# Patient Record
Sex: Female | Born: 1940 | Race: Black or African American | Hispanic: No | Marital: Single
Health system: Southern US, Community
[De-identification: ages and names within clinical notes are randomized; demographics above are authoritative.]

---

## 2011-05-22 ENCOUNTER — Ambulatory Visit: Payer: Self-pay | Admitting: Internal Medicine

## 2011-05-26 ENCOUNTER — Inpatient Hospital Stay: Payer: Self-pay | Admitting: Surgery

## 2011-05-26 LAB — URINALYSIS, COMPLETE
Bilirubin,UR: NEGATIVE
Blood: NEGATIVE
Glucose,UR: NEGATIVE mg/dL (ref 0–75)
Hyaline Cast: 3
Leukocyte Esterase: NEGATIVE
Protein: 100
RBC,UR: 7 /HPF (ref 0–5)
Specific Gravity: 1.018 (ref 1.003–1.030)
Squamous Epithelial: NONE SEEN
WBC UR: 2 /HPF (ref 0–5)

## 2011-05-26 LAB — CBC
HCT: 36.6 % (ref 35.0–47.0)
MCHC: 31.2 g/dL — ABNORMAL LOW (ref 32.0–36.0)
Platelet: 202 10*3/uL (ref 150–440)
RBC: 4.38 10*6/uL (ref 3.80–5.20)
RDW: 17.9 % — ABNORMAL HIGH (ref 11.5–14.5)
WBC: 10.5 10*3/uL (ref 3.6–11.0)

## 2011-05-26 LAB — COMPREHENSIVE METABOLIC PANEL
Albumin: 2.2 g/dL — ABNORMAL LOW (ref 3.4–5.0)
BUN: 51 mg/dL — ABNORMAL HIGH (ref 7–18)
Chloride: 104 mmol/L (ref 98–107)
Co2: 25 mmol/L (ref 21–32)
Creatinine: 2.98 mg/dL — ABNORMAL HIGH (ref 0.60–1.30)
Osmolality: 298 (ref 275–301)
Potassium: 2.9 mmol/L — ABNORMAL LOW (ref 3.5–5.1)
SGOT(AST): 18 U/L (ref 15–37)
Sodium: 143 mmol/L (ref 136–145)

## 2011-05-26 LAB — TROPONIN I: Troponin-I: 0.07 ng/mL — ABNORMAL HIGH

## 2011-05-26 LAB — LIPASE, BLOOD: Lipase: 51 U/L — ABNORMAL LOW (ref 73–393)

## 2011-05-27 LAB — CBC WITH DIFFERENTIAL/PLATELET
Bands: 29 %
Basophil: 1 %
HCT: 32.8 % — ABNORMAL LOW (ref 35.0–47.0)
HGB: 10.2 g/dL — ABNORMAL LOW (ref 12.0–16.0)
MCH: 26 pg (ref 26.0–34.0)
MCHC: 31 g/dL — ABNORMAL LOW (ref 32.0–36.0)
MCV: 84 fL (ref 80–100)
Platelet: 182 10*3/uL (ref 150–440)
RBC: 3.91 10*6/uL (ref 3.80–5.20)
Segmented Neutrophils: 60 %

## 2011-05-27 LAB — COMPREHENSIVE METABOLIC PANEL
Alkaline Phosphatase: 71 U/L (ref 50–136)
Anion Gap: 17 — ABNORMAL HIGH (ref 7–16)
BUN: 56 mg/dL — ABNORMAL HIGH (ref 7–18)
Bilirubin,Total: 1.3 mg/dL — ABNORMAL HIGH (ref 0.2–1.0)
Chloride: 105 mmol/L (ref 98–107)
Creatinine: 3.1 mg/dL — ABNORMAL HIGH (ref 0.60–1.30)
EGFR (African American): 19 — ABNORMAL LOW
EGFR (Non-African Amer.): 16 — ABNORMAL LOW
SGOT(AST): 20 U/L (ref 15–37)
SGPT (ALT): 14 U/L
Sodium: 145 mmol/L (ref 136–145)
Total Protein: 7.2 g/dL (ref 6.4–8.2)

## 2011-05-28 LAB — CBC WITH DIFFERENTIAL/PLATELET
Bands: 16 %
HCT: 31.1 % — ABNORMAL LOW (ref 35.0–47.0)
HGB: 9.3 g/dL — ABNORMAL LOW (ref 12.0–16.0)
MCH: 24.6 pg — ABNORMAL LOW (ref 26.0–34.0)
Monocytes: 5 %
Platelet: 184 10*3/uL (ref 150–440)
RBC: 3.79 10*6/uL — ABNORMAL LOW (ref 3.80–5.20)
Segmented Neutrophils: 70 %

## 2011-05-28 LAB — BASIC METABOLIC PANEL
Calcium, Total: 8.4 mg/dL — ABNORMAL LOW (ref 8.5–10.1)
Creatinine: 3.11 mg/dL — ABNORMAL HIGH (ref 0.60–1.30)
EGFR (African American): 19 — ABNORMAL LOW
EGFR (Non-African Amer.): 16 — ABNORMAL LOW
Potassium: 3.2 mmol/L — ABNORMAL LOW (ref 3.5–5.1)
Sodium: 145 mmol/L (ref 136–145)

## 2011-05-29 LAB — CBC WITH DIFFERENTIAL/PLATELET
Basophil #: 0 10*3/uL (ref 0.0–0.1)
Basophil %: 0 %
Eosinophil #: 0 10*3/uL (ref 0.0–0.7)
Eosinophil %: 0.2 %
HCT: 28.7 % — ABNORMAL LOW (ref 35.0–47.0)
HGB: 9.1 g/dL — ABNORMAL LOW (ref 12.0–16.0)
Lymphocyte %: 5.2 %
MCHC: 31.7 g/dL — ABNORMAL LOW (ref 32.0–36.0)
Monocyte %: 5.2 %
Neutrophil #: 13.1 10*3/uL — ABNORMAL HIGH (ref 1.4–6.5)
Neutrophil %: 89.4 %
RBC: 3.48 10*6/uL — ABNORMAL LOW (ref 3.80–5.20)
RDW: 18 % — ABNORMAL HIGH (ref 11.5–14.5)
WBC: 14.7 10*3/uL — ABNORMAL HIGH (ref 3.6–11.0)

## 2011-05-29 LAB — BASIC METABOLIC PANEL
BUN: 64 mg/dL — ABNORMAL HIGH (ref 7–18)
Calcium, Total: 8.7 mg/dL (ref 8.5–10.1)
Creatinine: 3.05 mg/dL — ABNORMAL HIGH (ref 0.60–1.30)
Glucose: 159 mg/dL — ABNORMAL HIGH (ref 65–99)
Potassium: 3.5 mmol/L (ref 3.5–5.1)
Sodium: 143 mmol/L (ref 136–145)

## 2011-05-30 LAB — BASIC METABOLIC PANEL
Calcium, Total: 8.5 mg/dL (ref 8.5–10.1)
Chloride: 107 mmol/L (ref 98–107)
Co2: 20 mmol/L — ABNORMAL LOW (ref 21–32)
Creatinine: 3.01 mg/dL — ABNORMAL HIGH (ref 0.60–1.30)
EGFR (African American): 20 — ABNORMAL LOW
Sodium: 142 mmol/L (ref 136–145)

## 2011-05-31 LAB — BASIC METABOLIC PANEL
Anion Gap: 15 (ref 7–16)
Calcium, Total: 8.3 mg/dL — ABNORMAL LOW (ref 8.5–10.1)
Chloride: 106 mmol/L (ref 98–107)
Co2: 19 mmol/L — ABNORMAL LOW (ref 21–32)
Creatinine: 2.97 mg/dL — ABNORMAL HIGH (ref 0.60–1.30)
EGFR (African American): 20 — ABNORMAL LOW
Osmolality: 300 (ref 275–301)

## 2011-05-31 LAB — CBC WITH DIFFERENTIAL/PLATELET
Basophil #: 0 10*3/uL (ref 0.0–0.1)
Basophil %: 0.1 %
Eosinophil %: 1.1 %
HGB: 8.5 g/dL — ABNORMAL LOW (ref 12.0–16.0)
Lymphocyte #: 0.7 10*3/uL — ABNORMAL LOW (ref 1.0–3.6)
MCH: 26.3 pg (ref 26.0–34.0)
Monocyte #: 1.4 10*3/uL — ABNORMAL HIGH (ref 0.0–0.7)
Monocyte %: 10 %
Neutrophil %: 84 %
Platelet: 201 10*3/uL (ref 150–440)
RBC: 3.24 10*6/uL — ABNORMAL LOW (ref 3.80–5.20)

## 2011-06-01 LAB — CBC WITH DIFFERENTIAL/PLATELET
Basophil #: 0 10*3/uL (ref 0.0–0.1)
Basophil %: 0 %
Eosinophil #: 0.1 10*3/uL (ref 0.0–0.7)
Eosinophil %: 0.4 %
HCT: 27.2 % — ABNORMAL LOW (ref 35.0–47.0)
HGB: 8.5 g/dL — ABNORMAL LOW (ref 12.0–16.0)
Lymphocyte #: 0.5 10*3/uL — ABNORMAL LOW (ref 1.0–3.6)
MCH: 25.9 pg — ABNORMAL LOW (ref 26.0–34.0)
MCHC: 31.4 g/dL — ABNORMAL LOW (ref 32.0–36.0)
MCV: 82 fL (ref 80–100)
Monocyte %: 9.4 %
Platelet: 220 10*3/uL (ref 150–440)
RBC: 3.3 10*6/uL — ABNORMAL LOW (ref 3.80–5.20)
WBC: 15.7 10*3/uL — ABNORMAL HIGH (ref 3.6–11.0)

## 2011-06-01 LAB — BASIC METABOLIC PANEL
Calcium, Total: 8.2 mg/dL — ABNORMAL LOW (ref 8.5–10.1)
Chloride: 104 mmol/L (ref 98–107)
Co2: 17 mmol/L — ABNORMAL LOW (ref 21–32)
EGFR (African American): 20 — ABNORMAL LOW
Osmolality: 292 (ref 275–301)
Sodium: 136 mmol/L (ref 136–145)

## 2011-06-22 ENCOUNTER — Ambulatory Visit: Payer: Self-pay | Admitting: Internal Medicine

## 2011-09-21 ENCOUNTER — Ambulatory Visit: Payer: Self-pay | Admitting: Internal Medicine

## 2011-09-22 ENCOUNTER — Inpatient Hospital Stay: Payer: Self-pay | Admitting: Internal Medicine

## 2011-09-22 LAB — BASIC METABOLIC PANEL
Anion Gap: 8 (ref 7–16)
BUN: 33 mg/dL — ABNORMAL HIGH (ref 7–18)
Chloride: 103 mmol/L (ref 98–107)
Creatinine: 2.04 mg/dL — ABNORMAL HIGH (ref 0.60–1.30)
EGFR (African American): 28 — ABNORMAL LOW
EGFR (Non-African Amer.): 24 — ABNORMAL LOW
Glucose: 126 mg/dL — ABNORMAL HIGH (ref 65–99)

## 2011-09-22 LAB — PROTIME-INR
INR: 1.1
Prothrombin Time: 14.4 secs (ref 11.5–14.7)

## 2011-09-22 LAB — CBC
HCT: 22.5 % — ABNORMAL LOW (ref 35.0–47.0)
MCHC: 31.4 g/dL — ABNORMAL LOW (ref 32.0–36.0)
MCV: 82 fL (ref 80–100)
Platelet: 246 10*3/uL (ref 150–440)
RBC: 2.74 10*6/uL — ABNORMAL LOW (ref 3.80–5.20)
RDW: 17.8 % — ABNORMAL HIGH (ref 11.5–14.5)
WBC: 9.2 10*3/uL (ref 3.6–11.0)

## 2011-09-22 LAB — URINALYSIS, COMPLETE
Bilirubin,UR: NEGATIVE
Ketone: NEGATIVE
Ph: 6 (ref 4.5–8.0)
Protein: 100
RBC,UR: 91 /HPF (ref 0–5)
Squamous Epithelial: 4

## 2011-09-22 LAB — DIGOXIN LEVEL: Digoxin: 0.31 ng/mL

## 2011-09-23 LAB — IRON AND TIBC
Iron Bind.Cap.(Total): 119 ug/dL — ABNORMAL LOW (ref 250–450)
Unbound Iron-Bind.Cap.: 9 ug/dL

## 2011-09-23 LAB — BASIC METABOLIC PANEL
Anion Gap: 8 (ref 7–16)
BUN: 33 mg/dL — ABNORMAL HIGH (ref 7–18)
Calcium, Total: 7.9 mg/dL — ABNORMAL LOW (ref 8.5–10.1)
Chloride: 106 mmol/L (ref 98–107)
Co2: 24 mmol/L (ref 21–32)
Creatinine: 1.92 mg/dL — ABNORMAL HIGH (ref 0.60–1.30)
EGFR (Non-African Amer.): 26 — ABNORMAL LOW
Sodium: 138 mmol/L (ref 136–145)

## 2011-09-23 LAB — CBC WITH DIFFERENTIAL/PLATELET
Basophil #: 0.1 10*3/uL (ref 0.0–0.1)
Basophil %: 0.9 %
Eosinophil %: 1.4 %
HGB: 6.5 g/dL — ABNORMAL LOW (ref 12.0–16.0)
Lymphocyte #: 2.4 10*3/uL (ref 1.0–3.6)
Lymphocyte %: 27.8 %
Monocyte #: 0.9 x10 3/mm (ref 0.2–0.9)
Monocyte %: 10.1 %
Neutrophil %: 59.8 %
RBC: 2.54 10*6/uL — ABNORMAL LOW (ref 3.80–5.20)
WBC: 8.8 10*3/uL (ref 3.6–11.0)

## 2011-09-23 LAB — HEMATOCRIT: HCT: 24.8 % — ABNORMAL LOW (ref 35.0–47.0)

## 2011-09-23 LAB — MAGNESIUM: Magnesium: 1.7 mg/dL — ABNORMAL LOW

## 2011-09-23 LAB — OCCULT BLOOD X 1 CARD TO LAB, STOOL: Occult Blood, Feces: NEGATIVE

## 2011-09-24 LAB — BASIC METABOLIC PANEL
Anion Gap: 7 (ref 7–16)
Chloride: 109 mmol/L — ABNORMAL HIGH (ref 98–107)
Co2: 22 mmol/L (ref 21–32)
Creatinine: 1.9 mg/dL — ABNORMAL HIGH (ref 0.60–1.30)
EGFR (African American): 31 — ABNORMAL LOW
Osmolality: 282 (ref 275–301)

## 2011-09-24 LAB — CBC WITH DIFFERENTIAL/PLATELET
Basophil %: 0.5 %
Eosinophil #: 0.1 10*3/uL (ref 0.0–0.7)
Eosinophil %: 1.6 %
HCT: 24.3 % — ABNORMAL LOW (ref 35.0–47.0)
HGB: 7.7 g/dL — ABNORMAL LOW (ref 12.0–16.0)
Lymphocyte %: 20.5 %
MCH: 26 pg (ref 26.0–34.0)
MCV: 82 fL (ref 80–100)
Neutrophil %: 66.1 %
WBC: 7.1 10*3/uL (ref 3.6–11.0)

## 2011-09-25 LAB — CLOSTRIDIUM DIFFICILE BY PCR

## 2011-09-26 LAB — CBC WITH DIFFERENTIAL/PLATELET
Basophil #: 0.1 10*3/uL (ref 0.0–0.1)
Basophil %: 0.8 %
Eosinophil %: 1.5 %
HGB: 7.4 g/dL — ABNORMAL LOW (ref 12.0–16.0)
Lymphocyte #: 1.7 10*3/uL (ref 1.0–3.6)
Lymphocyte %: 18.9 %
MCH: 26.8 pg (ref 26.0–34.0)
MCV: 83 fL (ref 80–100)
Monocyte %: 9.6 %
Neutrophil %: 69.2 %
Platelet: 215 10*3/uL (ref 150–440)
RBC: 2.77 10*6/uL — ABNORMAL LOW (ref 3.80–5.20)
RDW: 17.5 % — ABNORMAL HIGH (ref 11.5–14.5)

## 2011-09-28 LAB — CULTURE, BLOOD (SINGLE)

## 2011-10-01 LAB — CULTURE, BLOOD (SINGLE)

## 2011-10-17 LAB — CULTURE, BLOOD (SINGLE)

## 2011-10-22 ENCOUNTER — Ambulatory Visit: Payer: Self-pay | Admitting: Internal Medicine

## 2011-12-22 DEATH — deceased

## 2013-01-23 IMAGING — CR DG CHEST 1V PORT
1 series · 1 of 1 positions shown · non-contrast
Comparison: none

REASON FOR EXAM: VERIFY PICC LINE PLACEMENT
COMMENTS:

PROCEDURE:     DXR - DXR PORTABLE CHEST SINGLE VIEW  - September 25, 2011  [DATE]
RESULT:     Comparison: 09/22/2011

[portable]
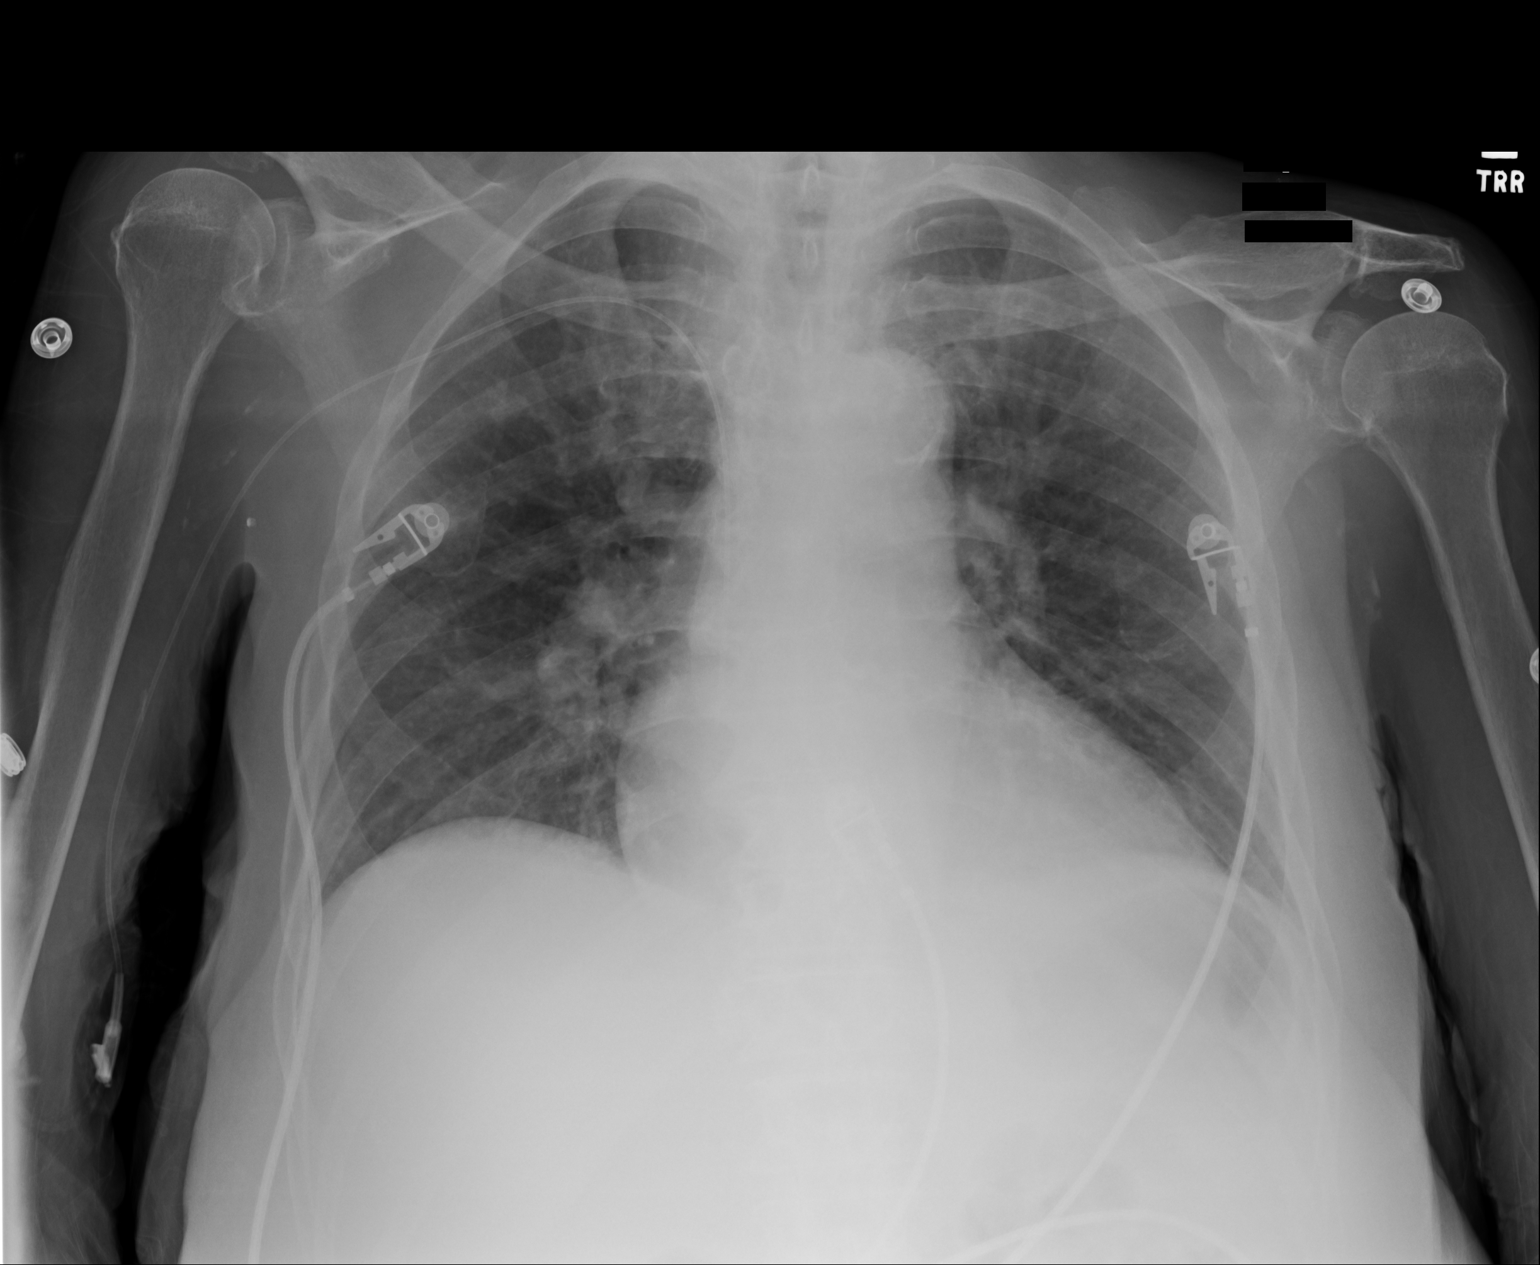

[1 of 1 positions shown; findings below may reference images not displayed]

FINDINGS: Single portable AP chest radiograph is provided. There is a right-sided PICC
line with the tip projecting over the cavoatrial junction. There is no focal
parenchymal opacity, pleural effusion, or pneumothorax. Normal
cardiomediastinal silhouette. The osseous structures are unremarkable.
IMPRESSION: No acute disease of the che[REDACTED]

## 2014-07-15 NOTE — H&P (Signed)
Subjective/Chief Complaint alt mental status    History of Present Illness pt from nursing home. called by Dr Roxine Caddy pt with perf sig colon, altered ms.  Pt unable to give hx, has not answered a single question. no records in computer for last year. recent admit at [facility] for sepsis    Past History prob COPD, HTN, CAD, CHF has scar on abd, laparotomy?    Past Medical Health Coronary Artery Disease, Hypertension, COPD   ALLERGIES:  Levaquin: Rash  Family and Social History:   Family History not obtainable    Social History not obtainable    Place of Living Nursing Home   Review of Systems:   ROS Pt not able to provide ROS  no family or caregivers present,    Medications/Allergies Reviewed Medications/Allergies reviewed   Physical Exam:   GEN cachectic, thin, disheveled, noncommunicative    HEENT pink conjunctivae, dry oral mucosa, poor dentition    NECK supple    RESP normal resp effort  no use of accessory muscles  rhonchi    CARD regular rate    ABD positive tenderness  no hernia  soft  normal BS  mild perc tenderness no rebound, diffuse tenderness, scar midline    GU foley catheter in place    LYMPH negative neck    EXTR negative edema    SKIN skin turgor poor    NEURO noncommunicative    PSYCH lethargic   Routine Hem:  05-Mar-13 13:11    WBC (CBC) 10.5   RBC (CBC) 4.38   Hemoglobin (CBC) 11.4   Hematocrit (CBC) 36.6   Platelet Count (CBC) 202   MCV 84   MCH 26.1   MCHC 31.2   RDW 17.9  Routine Chem:  05-Mar-13 13:11    Glucose, Serum 78   BUN 51   Creatinine (comp) 2.98   Sodium, Serum 143   Potassium, Serum 2.9   Chloride, Serum 104   CO2, Serum 25   Calcium (Total), Serum 8.9  Hepatic:  05-Mar-13 13:11    Bilirubin, Total 1.6   Alkaline Phosphatase 84   SGPT (ALT) 12   SGOT (AST) 18   Total Protein, Serum 7.9   Albumin, Serum 2.2  Routine Chem:  05-Mar-13 13:11    Osmolality (calc) 298   eGFR (African American) 20   eGFR  (Non-African American) 17   Anion Gap 14  Cardiac:  05-Mar-13 13:11    Troponin I 0.07  Routine UA:  05-Mar-13 13:11    Color (UA) Amber   Clarity (UA) Hazy   Glucose (UA) Negative   Bilirubin (UA) Negative   Ketones (UA) Negative   Specific Gravity (UA) 1.018   Blood (UA) Negative   pH (UA) 7.0   Nitrite (UA) Negative   Leukocyte Esterase (UA) Negative   RBC (UA) 7 /HPF   WBC (UA) 2 /HPF   Bacteria (UA) TRACE   Transitional Epithelial (UA) 7 /HPF   Mucous (UA) PRESENT   Hyaline Cast (UA) 3 /LPF   Granular Cast (UA) 16 /LPF  Blood Glucose:  05-Mar-13 13:11    POCT Blood Glucose 86  Routine Chem:  05-Mar-13 13:13    Lipase 51     Assessment/Admission Diagnosis sigmoid diverticulitis with microperf dehydrated admit, hydrate abx reexamine correct electrolytes, prerenal azotemia will get IM consult if unable to correct lytes/RF quickly Dr Roxine Caddy said pt is DNR but no family or caregiver present to confirm. Surgery if worsens   Electronic Signatures: Burt Knack,  Jerrol Banana (MD)  (Signed [phone] 16:19)  Authored: CHIEF COMPLAINT and HISTORY, ALLERGIES, FAMILY AND SOCIAL HISTORY, REVIEW OF SYSTEMS, PHYSICAL EXAM, LABS, ASSESSMENT AND PLAN   Last Updated: 05-Mar-13 16:19 by Florene Glen (MD)

## 2014-07-15 NOTE — Discharge Summary (Signed)
PATIENT NAME:  Molly Leblanc, Molly Leblanc MR#:  [id number] DATE OF BIRTH:  [birth year]  DATE OF ADMISSION:  09/22/2011 DATE OF DISCHARGE:  09/26/2011  PRESENTING COMPLAINT: Hypotension.   DISCHARGE DIAGNOSES:  1. Sepsis due to extended-spectrum beta-lactamase producing urinary tract infection.  2. Hypotension secondary to hypovolemia and sepsis, resolved.  3. Acute on chronic chronic kidney disease stage IV.  4. Chronic atrial fibrillation, stable.  5. Anemia of chronic disease, status post 1 unit of blood transfusion. Hemoglobin at 7.7, Hemoccult negative.   6. Chronic systolic congestive heart failure, ejection fraction of 15% to 20%.  7. Type 2 diabetes, on sliding scale.  8. 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   CONDITION ON DISCHARGE: Fair.   CODE STATUS: NO CODE, DO NOT RESUSCITATE.   PROCEDURE: PICC line placement.   MEDICATIONS:  1. IV ertapenem 1 gram daily for six more days.  2. Coreg 12.5 p.o. b.i.d., hold if SBP less than 100.  3. Imdur SA 20 mg daily.  4. Multivitamin p.o. daily.  5. Pravachol 80 mg p.o. daily.  6. Remeron 15 mg 1/2 tablet at bedtime.  7. Spironolactone 25 mg daily.  8. Norco 5/325, 1 to 2 q.6 p.r.n.   9. Tramadol 50 mg q.6 p.r.n.  10. Sliding scale insulin.  11. Tylenol 650 p.o. q.4 p.r.n.   12. Protonix 40 mg daily.  13. Albuterol 2 puffs q.4 while awake p.r.n.   14. Digoxin 0.125 p.o. daily.  15. Lasix 20 mg p.o. daily.   LABORATORY, DIAGNOSTIC AND RADIOLOGICAL DATA: White count 9.1, hemoglobin and hematocrit 7.4 and 22.9, platelet count 219. Chest x-ray shows no acute disease of the chest. Right side PICC line tip projecting in the cavoatrial junction.   Blood cultures no growth in 18 to 24 hours. Clostridium difficile is negative. Magnesium 1.8, serum digoxin 0.46.   Creatinine 1.9, glucose 98, potassium 4.2. Iron studies are within normal limits. Occult feces is negative.   Urinalysis positive for urinary tract infection. Urine culture  shows ESBL producing Escherichia coli. Creatinine on admission 2.02.   CONSULTATIONS: None.   BRIEF SUMMARY OF HOSPITAL COURSE: Ms. Mcquary is a 74 year old African American female with past medical history of atrial fibrillation, cardiomyopathy, ejection fraction of 15% to 20%, gastric lymphoma, chronic kidney disease stage IV, hypertension, hyperlipidemia comes from the nursing home who is bedbound there with low hemoglobin of 6.5 and blood pressure of 74/51. She was admitted with:  1. Septic shock which is suspected from severe urinary tract infection, acute cystitis, with ESBL producing Escherichia coli. She was initially on Rocephin which was discontinued and started on ertapenem. She will take a total of 10 days of IV antibiotic treatment and got a PICC line placement and will finish up the antibiotics at the nursing home.  2. Hypotension, improved after IV fluids which were given cautiously which was suspected due to sepsis.   3. Chronic atrial fibrillation, now stable. Her Coreg has been resumed. We cut the dose down to 12.5 b.i.d. and continued digoxin. Heart rate is much better.  4. Anemia of chronic disease. Initially patient came in with hemoglobin of 6.5. She received 1 unit of blood transfusion, however, her serum iron studies are normal and her Hemoccult is negative. This appears to be anemia of chronic disease and hence no more transfusion was given.  5. Chronic systolic congestive heart failure with ejection fraction of 15% to 20%. Patient is not in heart failure and continued cautious hydration. Once patient started taking orally,  IV fluids were discharged. Her Coreg and spironolactone were resumed. Her Lasix has been held which I will resume at a low dose which is 20 mg p.o. daily.   6. Type 2 diabetes. Patient's Lantus has been discontinued. Her sugars remain very well controlled on sliding scale for which will continue sliding scale at present.  7. Gastric lymphoma, chemotherapy in  the past.  8. Palliative care saw patient. She is a NO CODE, DO NOT RESUSCITATE. I did attempt to call patient's daughter, Sheral Flowdwina, however, could not leave a message given her voice mail not accepting message. She will be discharged to nursing home which ever patient picks to go with after they discuss with care managers.   TIME SPENT: 40 minutes.   ____________________________ Wylie HailSona A. Allena KatzPatel, MD sap:cms D: 09/26/2011 12:46:08 ET T: 09/28/2011 11:36:02 ET JOB#: [id number]  cc: Tula Schryver A. Allena KatzPatel, MD, <Dictator>  Willow OraSONA A Jaclynne Baldo MD ELECTRONICALLY SIGNED 10/08/2011 13:46

## 2014-07-15 NOTE — H&P (Signed)
PATIENT NAME:  Molly MurrainSCHALL, Jadalyn MR#:  [id number] DATE OF BIRTH:  May 12, 1940  DATE OF ADMISSION:  05/26/2011  CHIEF COMPLAINT: Altered mental status.   HISTORY OF PRESENT ILLNESS: This is a 74 year old nursing home resident who was transported to the emergency room. I was called by Dr. Buford DresserFinnell after a thorough work-up performed by her suggested perforated sigmoid diverticulitis. She has been experiencing altered mental status and no history is obtainable from the patient. There is no caregiver and no family present and no records in the computer for this hospital. Dr. Buford DresserFinnell states that the patient is a DO NOT RESUSCITATE, but I cannot confirm that. She has also stated to me that [facility].   The patient is noncommunicative and does not complain of abdominal pain or anything else. In fact she does not answer any questions in spite of her looking right at you with her eyes open, but is completely noncommunicative.   PAST MEDICAL HISTORY: Judging from her medication list, she has coronary artery disease and congestive heart failure, probable hypertension, and chronic obstructive pulmonary disease.   PAST SURGICAL HISTORY: There is a scar on her abdomen suggesting laparotomy of unclear need or purpose.   ALLERGIES: Levaquin.   FAMILY HISTORY: Not obtainable.   SOCIAL HISTORY: Not obtainable. Place of living is a nursing home.   REVIEW OF SYSTEMS: Not obtainable. There is no family and no caregivers present.   MEDICATIONS: Reviewed.     PHYSICAL EXAMINATION:   GENERAL: Chronically ill-appearing noncommunicative patient. She appears in no acute distress, but is disheveled, thin, and cachectic.  VITALS: Temperature 101.3, pulse 88, respirations 16, blood pressure 122/70, and 97% room air saturation. Nurses have documented a pain scale of 0, but the patient is noncommunicative.   HEENT: No scleral icterus. Bilateral pupils equal, round, and reactive to light.  Poor dentition. Very dry mucous membranes with crusting on the lips.   NECK: No palpable neck nodes.   CHEST: Bilateral rhonchi.   CARDIAC: Regular rate and rhythm.   ABDOMEN: Soft and nondistended. There is a midline scar near the umbilicus. Abdomen is otherwise soft, minimally tender. There is minimal percussion tenderness. No guarding and no rebound.   EXTREMITIES: No edema. Calves are nontender.   NEUROLOGIC: Grossly intact with the exception of her inability to answer questions.   INTEGUMENT: No jaundice.   LABS/STUDIES: CT scan is personally reviewed. It is read as being sigmoid diverticulitis. There are minimal areas of air bubbles around the colon and a single air bubble that I see up near the abdominal wall suggesting minimal perforation. No free fluid. There is a large cystic mass in the pelvis.   CT of the head is normal with the exception of her prior stroke apparently. There are no acute changes on  the CT of the head.   Chest x-ray as noted. No free air at the diaphragm.   Laboratory values demonstrate a clean urinalysis with 2 white blood cells per field and 100 mg/dL protein. Troponins 0.07. Electrolytes are grossly abnormal with a BUN of 51, creatinine 2.98, potassium 2.9, total bilirubin 1.6, and albumin of 2.2. White blood cell count is 10.5, hemoglobin and hematocrit 11.4 and 36.6, and platelet count 202.   ASSESSMENT AND PLAN: This is a patient with multiple medical problems with probable prior stroke, cardiac disease, congestive heart failure, hypertension, and probable chronic obstructive pulmonary disease who presents with altered mental status probably secondary to sepsis from perforated diverticulitis.  She has an indwelling Foley catheter. My plan would be to admit this patient to the hospital and hydrate her and start her on IV antibiotics, which Dr. Buford Dresser has done already in the ED. She appears hemodynamically stable. I would like to gently hydrate her and  reassess her. If she were to worsen at any time, she may require surgical intervention in the form of a colostomy. The patient's electrolytes need to be corrected as well with this hydration as well as her renal failure. If she does not correct very quickly, I will obtain an Internal Medicine consult for aid in assistance in managing her medical problems.   Dr. Buford Dresser stated that the patient is a DNR, but I cannot confirm that. There are no caregivers and no family present. With that in mind, I will keep her as a FULL CODE  at this time until confirmation can be obtained. I have started antibiotics, ordered labs, and x-rays for tomorrow morning, and we will continue this plan.  ____________________________ Adah Salvage. Excell Seltzer, MD rec:slb D: 05/26/2011 16:31:04 ET T: 05/26/2011 16:57:11 ET JOB#: [id number]  cc: Adah Salvage. Excell Seltzer, MD, <Dictator> Lattie Haw MD ELECTRONICALLY SIGNED 05/27/2011 7:29

## 2014-07-15 NOTE — Discharge Summary (Signed)
PATIENT NAME:  Molly MurrainSCHALL, Paytyn MR#:  [id number] DATE OF BIRTH:  Mar 05, 1941  DATE OF ADMISSION:  05/26/2011 DATE OF DISCHARGE:  06/01/2011  FINAL DIAGNOSES:  1. Microperforation of sigmoid diverticulitis.  2. DO NOT RESUSCITATE. 3. Altered mental status.  4. Hypertension.  5. Congestive heart failure.  6. Chronic obstructive pulmonary disease.  7. Coronary artery disease.   HOSPITAL COURSE SUMMARY: The patient was admitted to the surgical service with contained microperforation. She remained afebrile. She was treated with intravenous Zosyn. She has a known history of chronic renal insufficiency for which she had hydration. Renal Medicine saw her on 05/31/2011 and did not recommend institution of dialysis. Palliative Care Medicine consultation was obtained. No surgical intervention was undertaken. She remained afebrile, tolerated a regular diet, and was discharged back to the nursing home in stable condition on 06/01/2011 with followup in our office in 1 to 2 weeks' time.   DISCHARGE MEDICATIONS: Same as admission medications. In addition: 1. Augmentin 500 mg by mouth twice a day for seven days.  2. Flagyl 500 mg by mouth three times daily for seven days. ____________________________ Redge GainerMark A. Egbert GaribaldiBird, MD mab:slb D: 06/01/2011 11:27:14 ET T: 06/01/2011 11:39:45 ET JOB#: [id number]  cc: Loraine LericheMark A. Egbert GaribaldiBird, MD, <Dictator> Raynald KempMARK A Devinn Hurwitz MD ELECTRONICALLY SIGNED 06/01/2011 17:45

## 2014-07-15 NOTE — Consult Note (Signed)
PATIENT NAME:  Molly Leblanc, Molly Leblanc MR#:  [id number] DATE OF BIRTH:  06/15/1940  DATE OF CONSULTATION:  05/27/2011  REFERRING PHYSICIAN: Dionne Miloichard Cooper, MD   CONSULTING PHYSICIAN:  Herschell Dimesichard J. Rikki Smestad, MD  REASON FOR CONSULTATION: Medical management of renal failure, hypertension, heart failure, atrial fibrillation.   HISTORY OF PRESENT ILLNESS: This is a 74 year old female who does not talk much and is unable to give any history whatsoever. Unfortunately, the phone number of the primary contact mailbox was full and I was unable to reach a primary contact. Looking through [facility], she was recently admitted there with pulmonary edema and systemic inflammatory response syndrome and was sent over to [facility]. She was admitted by Dr. Excell Seltzerooper on 05/26/2011 for a perforated diverticula. He has been treating with IV Zosyn at this point.   PAST MEDICAL HISTORY:  1. Congestive heart failure. 2. Cerebrovascular accident with aphasia.  3. Diabetes.  4. Chronic kidney disease. Creatinine [facility] is 2.12. 5. Hypertension.  6. Hyperlipidemia.  7. Lung nodule. 8. Hypoxia.   PAST SURGICAL HISTORY: Unknown. Does have a scar on her abdomen.   ALLERGIES: As per the chart, Levaquin.   FAMILY HISTORY: Unable to obtain secondary to altered mental status.   SOCIAL HISTORY: Unable to obtain secondary to altered mental status. The patient came over from [facility].   MEDICATIONS FROM [facility]:  1. Mylanta 30 mL p.o. q.4 hours p.r.n.  2. Nitrostat 0.4 mg every five minutes p.r.n.  3. Proventil inhaler 2 puffs every four hours p.r.n.  4. Tramadol 50 mg p.r.n. pain. 5. Multivitamin with minerals 1 tablet daily.  6. Omeprazole 20 mg daily.  7. Pravachol 80 mg daily.  8. Spironolactone 25 mg half-tablet daily.  9. Digoxin 125 mcg 1 tablet 3 times a week. 10. Lasix 40 mg twice a day.  11. Hydralazine 100 mg 3 times a day.  12. Isosorbide  dinitrate 20 mg 2 tablets 3 times a day. 13. Lantus 10 units in the morning.  14. Aspirin 325 mg daily.  15. Coreg 25 mg twice a day.   REVIEW OF SYSTEMS: Unable to obtain secondary to altered mental status.   PHYSICAL EXAMINATION:   CURRENT VITAL SIGNS: Temperature 98.3, pulse 81, blood pressure 144/76, pulse oximetry 93% on room air.   GENERAL: No respiratory distress.   EYES: Conjunctivae no icterus. Lids normal. Pupils equal, round, and reactive to light. Unable to test extraocular muscles secondary to altered mental status.   EARS, NOSE, MOUTH, AND THROAT: Nasal mucosa no erythema. Throat no erythema. No exudate seen. Lips no lesions.   NECK: Currently no JVD. No bruits. No lymphadenopathy. No thyromegaly. No thyroid nodules palpated.   RESPIRATORY: Lungs anteriorly clear to auscultation. At the lung bases slight rhonchi and decreased breath sounds.   CARDIOVASCULAR: S1, S2 normal. No gallops, rubs, or murmurs heard. Rate controlled. 1+ edema of the lower extremity. Carotid upstroke 2+ bilaterally. No bruits.   EXTREMITIES: Dorsalis pedis pulses 1+ bilaterally.   ABDOMEN: Soft. Positive tenderness throughout the entire abdomen. Patient guarding. No organomegaly/splenomegaly.   LYMPHATIC: No lymph nodes in the neck.   MUSCULOSKELETAL: 1+ edema. No clubbing. No cyanosis.   SKIN: Unable to see any ulcers or chronic lower extremity discoloration.   NEUROLOGIC: Cranial nerves unable to test secondary to altered mental status. Deep tendon reflexes 1+ bilaterally. The patient is able to move her extremities on her own. Babinski positive on the right  foot.   PSYCHIATRIC: Unable to test secondary to altered mental status.   LABORATORY, DIAGNOSTIC, AND RADIOLOGICAL DATA: Latest laboratory data includes white blood cell count of 11.1, hemoglobin and hematocrit 10.2 and 32.8, platelet count 182, glucose 166, BUN 56, creatinine 3.10, sodium 145, potassium 3.2, chloride 105, CO2 23,  calcium 8.6, total bilirubin 1.3, alkaline phosphatase 71, ALT 14, AST 20, albumin 1.9, GFR 19. Digoxin level 1.44.  CT scan of the chest, abdomen, and pelvis showed findings consistent with perforated sigmoid diverticulitis. Large cystic structure in the right hemipelvis, could represent a large bladder diverticulum or right adnexal cystic structure, abscess less likely. Intermediate nodular density of the right upper lobe. Chest x-ray showed prominence of interstitial markings especially in the right upper lobe region. CT scan of the head showed no acute intracranial process. 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   ASSESSMENT AND PLAN:  1. Acute renal failure on chronic kidney disease. Continue gentle IV fluid hydration. Her creatinine is up from previous discharge at [facility]. Will get a sonogram of the abdomen. GFR is 19. Careful with Zosyn dosing. Pharmacy is dosing the Zosyn at this point. Unable to contact family about wishes on whether dialysis is wanted or not.  2. History of congestive heart failure and atrial fibrillation. Currently no signs of congestive heart failure. Decrease fluids to 50 mL per hour. I agree with stopping digoxin. It is not a good medication with renal failure. Continue Coreg and hydralazine at this point. Lasix is on hold with the acute renal failure. Continue to monitor fluid status closely.  3. Hypertension. Blood pressure is currently well controlled as well as heart rate.  4. History of cerebrovascular accident and aphasia. The patient was on aspirin as outpatient. Holding aspirin just in case surgery indicated.  5. History of diabetes. Will put on sliding scale. The patient does have D5W in the fluids for right now. Depending on what the sugars are, will switch the fluid to normal saline with potassium.  6. Hyperlipidemia. On pravastatin.  7. Hypokalemia. Potassium supplementation via IV fluids. Will also check a magnesium level in the a.m.  Careful with replacement with the acute renal failure.  8. Perforated sigmoid diverticulitis. Surgery following. Zosyn IV. Clear liquid diet.  9. Lung nodule.  10. I am unable to reach family about CODE STATUS. It looks like the patient may have been a DO NOT RESUSCITATE over at [facility]. Currently the patient is a FULL CODE here. May benefit from Palliative Care consultation.   TIME SPENT ON CONSULTATION: 50 minutes.   ____________________________ Herschell Dimes. Renae Gloss, MD rjw:drc D: 05/27/2011 19:40:32 ET T: 05/28/2011 09:52:39 ET JOB#: [id number]  cc: Herschell Dimes. Renae Gloss, MD, <Dictator> Rafaella Kole E. Excell Seltzer, MD Serita Sheller Maryellen Pile, MD Salley Scarlet MD ELECTRONICALLY SIGNED 05/29/2011 13:21

## 2014-07-15 NOTE — Consult Note (Signed)
PATIENT NAME:  Molly Leblanc, Molly Leblanc MR#:  [id number] DATE OF BIRTH:  [birth year]  DATE OF CONSULTATION:  05/31/2011  REFERRING PHYSICIAN:  Dr. Katharina Caper  CONSULTING PHYSICIAN:  Elya Diloreto Lizabeth Leyden, MD  REASON FOR CONSULTATION: Acute renal failure in the setting of known chronic kidney disease, stage IV.   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 The patient had recent hospitalization at [facility]. At that point in time the patient had presented with congestive heart failure exacerbation. She presented with altered mental status this admission. Upon initial work-up she had a CT scan of the abdomen and pelvis which demonstrated perforated sigmoid diverticulum. She was admitted under surgical service, however, medical consultation was obtained. The patient has been managed medically in regards to her perforated sigmoid diverticulum. She has been receiving IV antibiotics. There are no immediate surgical plans at this point in time. We are consulted for the evaluation and management of acute renal failure. Upon presentation the patient's creatinine was 2.98. It is reported to [place] that her baseline creatinine is 2.1 from [facility]. The patient's creatinine currently is 2.97. The patient is unable to provide any history to me. She will answer some simple yes and no  questions but cannot provide any detailed history. Her oral mucosa appeared to be extremely dry at present. Therefore, dehydration is likely playing some role in her acute renal failure. The patient has had CT scan as well as abdominal ultrasound. Both were negative for hydronephrosis. The patient was also on diuretic therapy previously; however, this is currently on hold.   PAST MEDICAL HISTORY:  1. Hypertension.  2. Diabetes mellitus type 2.  3. Hyperlipidemia.  4. Atrial fibrillation with history of RVR.  5. Congestive heart failure, ejection fraction 15% to 20%.  6. 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  7. Chronic kidney disease stage III, baseline creatinine 2.1.  8. Tobacco abuse.  9. Diverticulosis.  10. Right sciatica.  11. Right MCA infarct with resultant aphasia.  12. Coronary artery disease status post NSTEMI and PCI.   ALLERGIES: Levaquin.  CURRENT INPATIENT MEDICATIONS:  1. Half-normal saline at 125 mL/h. 2. Tylenol 650 mg p.o. every four hours p.r.n.  3. Albuterol 2 puffs inhaled every four hours.  4. Coreg 25 mg b.i.d.  5. Morphine 2 to 4 mg IV every two hours p.r.n. pain.  6. Zofran 4 mg IV every six hours p.r.n. nausea.  7. Zosyn 3.375 grams IV q.12 hours.  8. Pravachol 40 mg at bedtime.  9. Phenergan suppository 25 mg every eight hours.  10. Sliding scale insulin.  11. Omeprazole 20 mg b.i.d.  12. Lantus insulin 10 units subcutaneous daily.   SOCIAL HISTORY: Currently unable to obtain from the patient, however, she currently resides at [facility] nursing home. There is a reported history of tobacco abuse in the past. Unable to determine if there is any active alcohol use.   FAMILY HISTORY: Unable to obtain directly from the patient, however, from prior  records it appears that the patient's father had history of stroke and died of a myocardial infarction. Patient's mother had cancer of unknown type.   REVIEW OF SYSTEMS: Currently  unable to obtain from the patient given her severe aphasia.   PHYSICAL EXAMINATION:  VITAL SIGNS: Temperature 97.6, pulse 79, respirations 18, blood pressure 94/61, pulse oximetry 100%.   GENERAL: Chronically ill-appearing African American female currently no acute distress.   HEENT: Normocephalic, atraumatic. Extraocular movements are intact. Pupils equal, round, reactive to light. No scleral icterus. Conjunctivae are pale. No epistaxis noted. Difficult to assess her hearing. Oral mucosa are very dry.   NECK: Supple without JVD, lymphadenopathy.   LUNGS: Clear to auscultation bilaterally with normal respiratory effort.   CARDIOVASCULAR: S1, S2. Patient noted to be irregular. 2/6 systolic ejection murmur heard.   ABDOMEN: Currently soft. No appreciable tenderness at this time. No rebound or guarding.   EXTREMITIES: No clubbing, cyanosis, or edema.   NEUROLOGIC: Patient is able to move both upper extremities. She was laying in a position that made it difficult to move her lower extremities but she did wiggle her toes.   SKIN: Warm and dry. No rashes noted.   MUSCULOSKELETAL: No joint redness, swelling or tenderness appreciated.   PSYCHIATRIC: Patient with rather flat affect at present. Difficult to assess her insight given her inability to speak.   GENITOURINARY: No suprapubic tenderness noted at this time.   LABORATORY, DIAGNOSTIC AND RADIOLOGICAL DATA: Sodium 140, potassium 3.7, chloride 106, CO2 19, BUN 66, creatinine 2.97, glucose 132. CBC shows WBC 13.8, hemoglobin 8.5, hematocrit 26, platelets 201, MCV 82, magnesium 2.0. Ultrasound abdomen shows the right kidney measuring 10.1 cm and the left kidney measured 10.3 cm. There was increased echogenicity of the right kidney. No hydronephrosis noted. CT chest, abdomen and pelvis demonstrated perforated sigmoid diverticulitis. There was a large cystic structure in the right hemipelvis. There is also indeterminate nodular density in the  right upper lobe. Blood cultures were negative. Digoxin level 1.39 on 05/26/2011.   IMPRESSION: This is a 74 year old African American female with past medical history of hypertension, diabetes mellitus, hyperlipidemia, atrial fibrillation with history of RVR, congestive heart failure with ejection fraction of 15% to 20%, gastric lymphoma status post 2R-CHOP regimen, chronic kidney disease stage III, diverticulosis, right sciatica, right MCA infarct with resultant aphagia, coronary artery disease, right lung nodule that is chronic who presented to [facility] with altered mental status and found to have perforated sigmoid diverticulum that is currently being managed medically.   PROBLEM LIST:  1. Acute renal failure.  2. Chronic kidney disease, stage IV, baseline creatinine 2.1.  3. Metabolic acidosis with serum bicarbonate low at 19.  4. Anemia of chronic kidney disease. Hemoglobin 8.5 and MCV of 82.  5. Perforated sigmoid diverticulum.  6. Dehydration.  7. Known systolic heart failure with ejection fraction 15% to 20%.   PLAN: The patient presents with a somewhat severe illness in the form of perforated sigmoid diverticulosis. However, she does not appear to be floridly septic at this point in time. Question as to whether the perforation has walled itself off at this point in time. However, she does appear to have acute renal failure in the setting of known chronic kidney disease, stage III. I suspect that dehydration is playing some role in her acute renal failure as she has very dry oral mucosa. Renal ultrasound was performed and there was no sign of hydronephrosis. I agree with IV fluid hydration with half-normal  saline; however, we will cut back the rate to 75 mL/h given her very low ejection fraction of 15% to 20% so as to avoid volume overload. There is no acute indication for dialysis at this point in time. The patient is not on any diuretics currently. She also has mild  hypotension and I agree with holding hydralazine at this point in time. Would continue to monitor her blood pressure and adjust her other antihypertensives as needed. In regards to her anemia, hemoglobin is quite low at 8.5. It has been drifting down over the past several days. We could consider serum iron studies, however, given the acute infection she would not be able to be administered any iron therefore we will hold off on checking iron studies. We will give the patient a dose of Epogen 20,000 units subcutaneous at present. She also has metabolic acidosis with a serum bicarbonate of 19 which is likely due to her chronic kidney disease as well as acute renal failure. We will hold off on giving the patient bicarbonate solution at this point in time. We will continue to monitor renal function over the next day or two. In her current state the patient make a poor long-term dialysis candidate.   I would like to thank Dr. Winona LegatoVaickute for this kind referral. Further plan as patient progresses. ____________________________ Lennox PippinsMunsoor N. Kiel Cockerell, MD mnl:cms D: 05/31/2011 12:26:39 ET T: 05/31/2011 12:41:18 ET  JOB#: [id number] Ria CommentMUNSOOR N Tammey Deeg MD ELECTRONICALLY SIGNED 06/17/2011 0:44

## 2014-07-15 NOTE — H&P (Signed)
PATIENT NAME:  Molly Leblanc, Molly Leblanc MR#:  [id number] DATE OF BIRTH:  February 16, 1941  DATE OF ADMISSION:  09/22/2011  PRIMARY CARE PHYSICIAN: Dr. Maryellen Pile   REFERRING PHYSICIAN: Dr. Shaune Pollack   CHIEF COMPLAINT: Anemia.    HISTORY OF PRESENT ILLNESS: The patient is a 74 year old African American female with a past history of atrial fibrillation, congestive heart failure with ejection fraction 15 to 20%, CKD with creatinine 1.52, hypertension, diabetes, hyperlipidemia, coronary artery disease, CVA, gastric lymphoma, and diverticulosis who was sent from nursing home due to decreased hemoglobin at 6.5. The patient is weak but nonverbal, noncommunicative, looks demented. According to Dr. Shaune Pollack, the patient was sent from nursing home due to hemoglobin at 6.5 but she does not know why they checked the hemoglobin. When the patient arrived in the ED, the patient's blood pressure was low at 74/51. She was treated with normal saline bolus and was noted to have a urinary tract infection treated with antibiotics. The patient's hemoglobin is 7.1. PRBC was ordered by Dr. Shaune Pollack.   PAST MEDICAL HISTORY: The patient could not give any information but, according to the documents, the patient has: 1. Atrial fibrillation. 2. Congestive heart failure.  3. CKD. 4. Hypertension. 5. Diabetes. 6. Hyperlipidemia. 7. Coronary artery disease. 8. CVA. 9. Lymphoma. 10. Diverticulosis.   SOCIAL HISTORY: The patient is from the nursing home. Unable to obtain further information. According to documents, the patient smoked less than half pack a day for 50 years. Occasional alcohol.   FAMILY HISTORY: Father had a CVA and died of MI at 37. Mother had cancer of unknown type. Multiple family members have hypertension, hyperlipidemia, and diabetes.   ALLERGIES: Levaquin and amiodarone.   MEDICATIONS: Please refer to the medication reconciliation list.    REVIEW OF SYSTEMS: Unable to obtain.   PHYSICAL EXAMINATION:   VITAL SIGNS: Temperature  98.6, blood pressure 92/57, pulse 114, oxygen saturation 95% on room air.   GENERAL: The patient is awake but noncommunicative, nonverbal.   HEENT: Pupils round, equal, and reactive to light. Mild dry oral mucosa. No discharge from ear or nose.   NECK: Supple. No JVD or carotid bruit. No. No lymphadenopathy. No thyromegaly.   CARDIOVASCULAR: S1, S2 irregular rate and rhythm, tachycardia. No murmurs or gallops.   PULMONARY: Bilateral air entry. No wheezing or rales.   ABDOMEN: Soft. No distention. Bowel sounds present. No organomegaly.   EXTREMITIES: No edema, clubbing, or cyanosis. Strong bilateral pedal pulses.   SKIN: No rash or jaundice.   NEUROLOGIC: The patient is nonverbal, awake, unable to examine at this time.   LABORATORY DATA: Urinalysis showed WBCs 1431, RBCs 91. CBC showed WBC 9.2, hemoglobin 7.1, platelet 246, glucose 126, BUN 33, creatinine 2.04, sodium 137, potassium 3.4, chloride 103, bicarb 26. Troponin less than 0.02. Digoxin 0.31. INR 1.1.   EKG showed atrial fibrillation at 123 beats per minute.   IMPRESSION:  1. Sepsis.  2. Hypotension possibly due to septic shock.  3. Anemia.  4. Urinary tract infection.  5. Acute renal failure on chronic kidney disease.  6. Atrial fibrillation.  7. Congestive heart failure with ejection fraction 15 to 20%.  8. History of hypertension. 9. Diabetes. 10. Hyperlipidemia. 11. Coronary artery disease. 12. CVA.  13. Gastric lymphoma. 14. Diverticulosis.  CODE STATUS: DO NOT RESUSCITATE.   PLAN OF TREATMENT:  1. The patient will be admitted to CCU.  2. Will hold the patient's hypertension medications including Coreg, Lasix, Hydralazine, isosorbide dinitrate, spironolactone. The patient was given one liter bolus  now. We will continue normal saline 100 mL/h. May discontinue normal saline if the patient's blood pressure is stable.  3. Follow-up for acute renal failure and CKD. Continue IV fluids and follow-up BMP.  4. For  anemia, follow-up blood transfusion and CBC. Hold aspirin.  5. For diabetes, will start sliding scale and hold Lantus at this time and may resume Lantus depending on the patient's Accu-Chek.  6. For urinary tract infection, we will continue Rocephin and follow-up CBC.  7. Will get a Palliative Care consult.  8. GI prophylaxis and DVT prophylaxis with TEDs.   ____________________________ Molly PollackQing Stephania Macfarlane, MD qc:drc D: 09/22/2011 22:04:08 ET T: 09/23/2011 06:04:38 ET JOB#: [id number]  cc: Molly PollackQing Shiloh Southern, MD, <Dictator>, Molly ShellerErnest B. Maryellen PileEason, MD Molly PollackQING Taneya Conkel MD ELECTRONICALLY SIGNED 09/23/2011 14:47
